# Patient Record
Sex: Male | Born: 1990 | Race: White | Hispanic: No | Marital: Single | Smoking: Current every day smoker
Health system: Southern US, Community
[De-identification: ages and names within clinical notes are randomized; demographics above are authoritative.]

---

## 2009-08-05 ENCOUNTER — Emergency Department (HOSPITAL_COMMUNITY): Admission: EM | Admit: 2009-08-05 | Discharge: 2009-08-06 | Payer: Self-pay | Admitting: Emergency Medicine

## 2012-01-09 ENCOUNTER — Emergency Department (HOSPITAL_COMMUNITY)
Admission: EM | Admit: 2012-01-09 | Discharge: 2012-01-09 | Disposition: A | Discharge status: Home / Self Care | Payer: Self-pay | Attending: Emergency Medicine | Admitting: Emergency Medicine

## 2012-01-09 ENCOUNTER — Encounter (HOSPITAL_COMMUNITY): Payer: Self-pay

## 2012-01-09 DIAGNOSIS — F17200 Nicotine dependence, unspecified, uncomplicated: Secondary | ICD-10-CM | POA: Insufficient documentation

## 2012-01-09 DIAGNOSIS — R21 Rash and other nonspecific skin eruption: Secondary | ICD-10-CM | POA: Insufficient documentation

## 2012-01-09 MED ORDER — PERMETHRIN 5 % EX CREA: TOPICAL_CREAM | CUTANEOUS | Status: DC

## 2012-01-09 MED ORDER — PERMETHRIN 5 % EX CREA: TOPICAL_CREAM | CUTANEOUS | Status: AC

## 2012-01-09 NOTE — ED Provider Notes (Signed)
History     CSN: [id number]  Arrival date & time 01/09/12  1510   First MD Initiated Contact with Patient 01/09/12 1613      Chief Complaint  Patient presents with  . Rash     Patient is a 21 y.o. male presenting with rash. The history is provided by the patient.  Rash  This is a recurrent problem. The current episode started more than 1 week ago. The problem has been gradually worsening. The problem is associated with nothing. There has been no fever. Affected Location: extremities, torso. The patient is experiencing no pain. The pain has been constant since onset. Associated symptoms include itching. Pertinent negatives include no pain and no weeping. He has tried antihistamines, steriods and anti-itch cream for the symptoms. The treatment provided mild relief.  pt denies fever/headache/tick bites or insect bites Has been seen at outside hospital, given meds with mild improvement then returns No other complaints No one else with these symptoms  PMh - none  History reviewed. No pertinent past surgical history.  No family history on file.  History  Substance Use Topics  . Smoking status: Current Everyday Smoker  . Smokeless tobacco: Not on file  . Alcohol Use: No      Review of Systems  Constitutional: Negative for fever.  Skin: Positive for itching and rash.    Allergies  Review of patient's allergies indicates no known allergies.  Home Medications   Current Outpatient Rx  Name Route Sig Dispense Refill  . PERMETHRIN 5 % EX CREA  Apply to affected area once 10 g 0    BP 134/68  Pulse 61  Temp 97.7 F (36.5 C) (Oral)  Resp 16  Ht 5\' 10"  (1.778 m)  Wt 135 lb (61.236 kg)  BMI 19.37 kg/m2  SpO2 100%  Physical Exam CONSTITUTIONAL: Well developed/well nourished HEAD AND FACE: Normocephalic/atraumatic EYES: EOMI/PERRL ENMT: Mucous membranes moist NECK: supple no meningeal signs SPINE:entire spine nontender CV: S1/S2 noted, no murmurs/rubs/gallops  noted LUNGS: Lungs are clear to auscultation bilaterally, no apparent distress ABDOMEN: soft, nontender, no rebound or guarding GU:no cva tenderness NEURO: Pt is awake/alert, moves all extremitiesx4 EXTREMITIES: pulses normal, full ROM, scattered papules to arms SKIN: warm, color normal, scattered papules to chest/abdomen No petechiae, no signs of vasculitic rash, spares palms PSYCH: no abnormalities of mood noted  ED Course  Procedures    1. Rash    Pt has not tried elimite as of yet, will try this due to recurrent rash He is nontoxic in appearance Stable for d/c   MDM  Nursing notes including past medical history and social history reviewed and considered in documentation         Joya Gaskins, MD 01/09/12 1701

## 2012-01-09 NOTE — ED Notes (Signed)
Complain of scatter rash that comes and goes

## 2012-03-10 ENCOUNTER — Encounter (HOSPITAL_COMMUNITY): Payer: Self-pay | Admitting: *Deleted

## 2012-03-10 ENCOUNTER — Emergency Department (HOSPITAL_COMMUNITY)
Admission: EM | Admit: 2012-03-10 | Discharge: 2012-03-10 | Disposition: A | Discharge status: Home / Self Care | Payer: Self-pay | Attending: Emergency Medicine | Admitting: Emergency Medicine

## 2012-03-10 DIAGNOSIS — K029 Dental caries, unspecified: Secondary | ICD-10-CM

## 2012-03-10 DIAGNOSIS — L089 Local infection of the skin and subcutaneous tissue, unspecified: Secondary | ICD-10-CM

## 2012-03-10 DIAGNOSIS — F17200 Nicotine dependence, unspecified, uncomplicated: Secondary | ICD-10-CM | POA: Insufficient documentation

## 2012-03-10 MED ORDER — LIDOCAINE HCL (PF) 1 % IJ SOLN
INTRAMUSCULAR | Status: AC
  Administered 2012-03-10: 5 mL
  Filled 2012-03-10: qty 5

## 2012-03-10 MED ORDER — MELOXICAM 15 MG PO TABS: ORAL_TABLET | ORAL | Status: DC

## 2012-03-10 MED ORDER — SULFAMETHOXAZOLE-TRIMETHOPRIM 800-160 MG PO TABS: 1.0000 | ORAL_TABLET | Freq: Two times a day (BID) | ORAL | Status: AC

## 2012-03-10 MED ORDER — CEPHALEXIN 500 MG PO CAPS: 500.0000 mg | ORAL_CAPSULE | Freq: Four times a day (QID) | ORAL | Status: AC

## 2012-03-10 MED ORDER — ONDANSETRON HCL 4 MG PO TABS
4.0000 mg | ORAL_TABLET | Freq: Once | ORAL | Status: AC
  Administered 2012-03-10: 4 mg via ORAL
  Filled 2012-03-10: qty 1

## 2012-03-10 MED ORDER — HYDROCODONE-ACETAMINOPHEN 5-325 MG PO TABS: 1.0000 | ORAL_TABLET | ORAL | Status: AC | PRN

## 2012-03-10 MED ORDER — CEFTRIAXONE SODIUM 1 G IJ SOLR
1.0000 g | Freq: Once | INTRAMUSCULAR | Status: AC
  Administered 2012-03-10: 1 g via INTRAMUSCULAR
  Filled 2012-03-10: qty 10

## 2012-03-10 MED ORDER — HYDROCODONE-ACETAMINOPHEN 5-325 MG PO TABS
1.0000 | ORAL_TABLET | Freq: Once | ORAL | Status: AC
  Administered 2012-03-10: 1 via ORAL
  Filled 2012-03-10: qty 1

## 2012-03-10 MED ORDER — KETOROLAC TROMETHAMINE 10 MG PO TABS
10.0000 mg | ORAL_TABLET | Freq: Once | ORAL | Status: AC
  Administered 2012-03-10: 10 mg via ORAL
  Filled 2012-03-10: qty 1

## 2012-03-10 NOTE — ED Provider Notes (Signed)
Medical screening examination/treatment/procedure(s) were performed by non-physician practitioner and as supervising physician I was immediately available for consultation/collaboration.  Jaceyon Strole, MD 03/10/12 1901 

## 2012-03-10 NOTE — ED Notes (Signed)
Pt c/o swelling to his right jaw and neck x 2 days.

## 2012-03-10 NOTE — ED Notes (Signed)
Patient with no complaints at this time. Respirations even and unlabored. Skin warm/dry. Discharge instructions reviewed with patient at this time. Patient given opportunity to voice concerns/ask questions. Patient discharged at this time and left Emergency Department with steady gait.   

## 2012-03-10 NOTE — ED Notes (Signed)
R side of face swollen. Slightly inflamed lymph nodes on R neck.  Approx. 2cm round scabbed area swollen on R jawline.  Neck is reddened and painful to light palpation.  Patient states it hurts to open mouth.

## 2012-03-10 NOTE — ED Provider Notes (Signed)
History     CSN: [id number]  Arrival date & time 03/10/12  1746   First MD Initiated Contact with Patient 03/10/12 1839      Chief Complaint  Patient presents with  . Facial Swelling    (Consider location/radiation/quality/duration/timing/severity/associated sxs/prior treatment) HPI Comments: Patient reports a two-day history of pain and swelling in the right jaw and neck. He first noticed areas coming up in the face near the shaving areas. He then noted pain from the angle of the jaw into the neck. He has not had problems swallowing or speaking. His been no problems with his breathing. He has not had any drainage from the lesions of the skin on the right side of the jaw. He has cavities, but has only had minimal problems with chewing. He has not had any high fever reported. He has taken Tylenol and ibuprofen but states the pain has not gone away.  The history is provided by the patient.    History reviewed. No pertinent past medical history.  History reviewed. No pertinent past surgical history.  History reviewed. No pertinent family history.  History  Substance Use Topics  . Smoking status: Current Everyday Smoker    Types: Cigarettes  . Smokeless tobacco: Not on file  . Alcohol Use: No      Review of Systems  Constitutional: Negative for activity change.       All ROS Neg except as noted in HPI  HENT: Positive for dental problem. Negative for nosebleeds and neck pain.   Eyes: Negative for photophobia and discharge.  Respiratory: Negative for cough, shortness of breath and wheezing.   Cardiovascular: Negative for chest pain and palpitations.  Gastrointestinal: Negative for abdominal pain and blood in stool.  Genitourinary: Negative for dysuria, frequency and hematuria.  Musculoskeletal: Negative for back pain and arthralgias.  Skin: Negative.   Neurological: Negative for dizziness, seizures and speech difficulty.  Psychiatric/Behavioral: Negative for hallucinations  and confusion.    Allergies  Review of patient's allergies indicates no known allergies.  Home Medications  No current outpatient prescriptions on file.  BP 137/73  Pulse 67  Temp 98.2 F (36.8 C) (Oral)  Resp 24  Ht 5\' 10"  (1.778 m)  Wt 135 lb (61.236 kg)  BMI 19.37 kg/m2  SpO2 99%  Physical Exam  Nursing note and vitals reviewed. Constitutional: He is oriented to person, place, and time. He appears well-developed and well-nourished.  Non-toxic appearance.  HENT:  Head: Normocephalic.  Right Ear: Tympanic membrane and external ear normal.  Left Ear: Tympanic membrane and external ear normal.       There are 3 raised red areas on the right side of the face. Most of them are in the shaving area. There is soreness to palpation at the right jaw and submental area. There are a few palpable nodes in the cervical chain.  There are multiple cavities of the right and left upper and lower jaw area there is no visible abscess in the mouth. There is no swelling under the tongue. The airway is patent, the uvula is in the midline.  Eyes: EOM and lids are normal. Pupils are equal, round, and reactive to light.  Neck: Normal range of motion. Neck supple. Carotid bruit is not present.  Cardiovascular: Normal rate, regular rhythm, intact distal pulses and normal pulses.   Murmur heard. Pulmonary/Chest: Breath sounds normal. No respiratory distress.  Abdominal: Soft. Bowel sounds are normal. There is no tenderness. There is no guarding.  Musculoskeletal: Normal range  of motion.  Lymphadenopathy:       Head (right side): No submandibular adenopathy present.       Head (left side): No submandibular adenopathy present.    He has no cervical adenopathy.  Neurological: He is alert and oriented to person, place, and time. He has normal strength. No cranial nerve deficit or sensory deficit.  Skin: Skin is warm and dry.  Psychiatric: He has a normal mood and affect. His speech is normal.    ED  Course  Procedures (including critical care time)  Labs Reviewed - No data to display No results found.   No diagnosis found.    MDM  I have reviewed nursing notes, vital signs, and all appropriate lab and imaging results for this patient. Patient has infected lesions of the skin in the shaving area. He has multiple dental caries on the right and left side. The patient is treated in the emergency department with Rocephin IM. He is given a prescription for Keflex 500 mg 4 times a day, Bactrim DS 2 times daily with food, and Norco one every 4 hours #15 tablets. Patient strongly advised to see his dentist as sone as possible.       Kathie Dike, [place] 03/10/12 [place]

## 2013-02-24 ENCOUNTER — Emergency Department (HOSPITAL_COMMUNITY): Payer: Self-pay

## 2013-02-24 ENCOUNTER — Encounter (HOSPITAL_COMMUNITY): Payer: Self-pay | Admitting: Emergency Medicine

## 2013-02-24 ENCOUNTER — Emergency Department (HOSPITAL_COMMUNITY)
Admission: EM | Admit: 2013-02-24 | Discharge: 2013-02-24 | Disposition: A | Discharge status: Home / Self Care | Payer: Self-pay | Attending: Emergency Medicine | Admitting: Emergency Medicine

## 2013-02-24 DIAGNOSIS — Y93H2 Activity, gardening and landscaping: Secondary | ICD-10-CM | POA: Insufficient documentation

## 2013-02-24 DIAGNOSIS — X500XXA Overexertion from strenuous movement or load, initial encounter: Secondary | ICD-10-CM | POA: Insufficient documentation

## 2013-02-24 DIAGNOSIS — S93401A Sprain of unspecified ligament of right ankle, initial encounter: Secondary | ICD-10-CM

## 2013-02-24 DIAGNOSIS — F17200 Nicotine dependence, unspecified, uncomplicated: Secondary | ICD-10-CM | POA: Insufficient documentation

## 2013-02-24 DIAGNOSIS — S93409A Sprain of unspecified ligament of unspecified ankle, initial encounter: Secondary | ICD-10-CM | POA: Insufficient documentation

## 2013-02-24 DIAGNOSIS — Y92009 Unspecified place in unspecified non-institutional (private) residence as the place of occurrence of the external cause: Secondary | ICD-10-CM | POA: Insufficient documentation

## 2013-02-24 MED ORDER — NAPROXEN 250 MG PO TABS: 250.0000 mg | ORAL_TABLET | Freq: Two times a day (BID) | ORAL | Status: DC

## 2013-02-24 MED ORDER — IBUPROFEN 400 MG PO TABS
400.0000 mg | ORAL_TABLET | Freq: Once | ORAL | Status: AC
  Administered 2013-02-24: 400 mg via ORAL
  Filled 2013-02-24: qty 1

## 2013-02-24 MED ORDER — HYDROCODONE-ACETAMINOPHEN 5-325 MG PO TABS: ORAL_TABLET | ORAL | Status: DC

## 2013-02-24 MED ORDER — HYDROCODONE-ACETAMINOPHEN 5-325 MG PO TABS
2.0000 | ORAL_TABLET | Freq: Once | ORAL | Status: AC
  Administered 2013-02-24: 2 via ORAL
  Filled 2013-02-24: qty 2

## 2013-02-24 NOTE — ED Notes (Signed)
Patient states he was weedeating earlier and he tripped and twisted his right ankle; patient states he thinks he was stung by a yellow jacket at the same time.

## 2013-02-24 NOTE — ED Provider Notes (Signed)
CSN: [id number]     Arrival date & time 02/24/13  0032 History     First MD Initiated Contact with Patient 02/24/13 0056     Chief Complaint  Patient presents with  . Ankle Injury    HPI Pt was seen at 0120. Per pt, c/o gradual onset and persistence of constant right ankle "pain" that began yesterday evening PTA. Pt states he was using the weedeater on his lawn and tripped, "twisting" his right ankle. Pt has been ambulatory since the incident. Pt c/o pain and swelling to his right ankle. Denies any other injuries. Denies open wounds, no erythema, no ecchymosis, no knee or foot pain, no tingling/numbness in extremities, no focal motor weakness, no fevers, no rash.    History reviewed. No pertinent past medical history.  History reviewed. No pertinent past surgical history.  History  Substance Use Topics  . Smoking status: Current Every Day Smoker    Types: Cigarettes  . Smokeless tobacco: Not on file  . Alcohol Use: Yes    Review of Systems ROS: Statement: All systems negative except as marked or noted in the HPI; Constitutional: Negative for fever and chills. ; ; Eyes: Negative for eye pain, redness and discharge. ; ; ENMT: Negative for ear pain, hoarseness, nasal congestion, sinus pressure and sore throat. ; ; Cardiovascular: Negative for chest pain, palpitations, diaphoresis, dyspnea and peripheral edema. ; ; Respiratory: Negative for cough, wheezing and stridor. ; ; Gastrointestinal: Negative for nausea, vomiting, diarrhea, abdominal pain, blood in stool, hematemesis, jaundice and rectal bleeding. . ; ; Genitourinary: Negative for dysuria, flank pain and hematuria. ; ; Musculoskeletal: Negative for back pain and neck pain. +right ankle pain, swelling.; ; Skin: Negative for pruritus, rash, abrasions, blisters, bruising and skin lesion.; ; Neuro: Negative for headache, lightheadedness and neck stiffness. Negative for weakness, altered level of consciousness , altered mental status,  extremity weakness, paresthesias, involuntary movement, seizure and syncope.       Allergies  Review of patient's allergies indicates no known allergies.  Home Medications   Current Outpatient Rx  Name  Route  Sig  Dispense  Refill  . HYDROcodone-acetaminophen (NORCO/VICODIN) 5-325 MG per tablet      1 or 2 tabs PO q6 hours prn pain   20 tablet   0   . meloxicam (MOBIC) 15 MG tablet      1 po bid with food for swelling and inflammation   12 tablet   0   . naproxen (NAPROSYN) 250 MG tablet   Oral   Take 1 tablet (250 mg total) by mouth 2 (two) times daily with a meal.   14 tablet   0    BP 118/69  Pulse 76  Temp(Src) 98.2 F (36.8 C) (Oral)  Resp 18  Ht 5\' 10"  (1.778 m)  Wt 135 lb (61.236 kg)  BMI 19.37 kg/m2  SpO2 100% Physical Exam 0125: Physical examination:  Nursing notes reviewed; Vital signs and O2 SAT reviewed;  Constitutional: Well developed, Well nourished, Well hydrated, In no acute distress; Head:  Normocephalic, atraumatic; Eyes: EOMI, PERRL, No scleral icterus; ENMT: Mouth and pharynx normal, Mucous membranes moist; Neck: Supple, Full range of motion, No lymphadenopathy; Cardiovascular: Regular rate and rhythm, No murmur, rub, or gallop; Respiratory: Breath sounds clear & equal bilaterally, No rales, rhonchi, wheezes.  Speaking full sentences with ease, Normal respiratory effort/excursion; Chest: Nontender, Movement normal; Abdomen: Soft, Nontender, Nondistended, Normal bowel sounds; Genitourinary: No CVA tenderness; Extremities: Pulses normal, +mild tenderness to palp  right lateral maleolar area w/localized edema, NMS intact right foot, strong pedal pp, LE muscle compartments soft.  No right proximal fibular head tenderness, no knee tenderness, no foot tenderness.  No deformity, no ecchymosis, no open wounds.  +plantarflexion of right foot w/calf squeeze.  No palpable gap right Achilles's tendon. No deformity. No calf edema or asymmetry.; Neuro: AA&Ox3, Major CN  grossly intact.  Speech clear. No gross focal motor or sensory deficits in extremities.; Skin: Color normal, Warm, Dry, no rash.   ED Course   Procedures  MDM  MDM Reviewed: previous chart, nursing note and vitals Interpretation: x-ray   Dg Ankle Complete Right 02/24/2013   *RADIOLOGY REPORT*  Clinical Data: Twisting injury 02/23/2013.  Right ankle pain.  RIGHT ANKLE - COMPLETE 3+ VIEW  Comparison: None.  Findings: There is soft tissue swelling about the lateral aspect of the ankle.  Underlying fracture or dislocation.  No tibiotalar joint effusion.  IMPRESSION: Lateral soft tissue swelling without underlying bony or joint abnormality.   Original Report Authenticated By: Holley Dexter, M.D.    [phone]:  No fx on XR, will tx symptomatically. Pt states he "might have gotten stung by a yellow jacket" on his leg, also today. No localized or diffuse rash to suggest insect sting. Dx and testing d/w pt.  Questions answered.  Verb understanding, agreeable to d/c home with outpt f/u.    Laray Anger, DO 02/25/13 1758

## 2015-11-11 ENCOUNTER — Emergency Department (HOSPITAL_COMMUNITY)
Admission: EM | Admit: 2015-11-11 | Discharge: 2015-11-11 | Disposition: A | Discharge status: Home / Self Care | Payer: Self-pay | Attending: Emergency Medicine | Admitting: Emergency Medicine

## 2015-11-11 ENCOUNTER — Encounter (HOSPITAL_COMMUNITY): Payer: Self-pay | Admitting: Emergency Medicine

## 2015-11-11 DIAGNOSIS — Z79899 Other long term (current) drug therapy: Secondary | ICD-10-CM | POA: Insufficient documentation

## 2015-11-11 DIAGNOSIS — K047 Periapical abscess without sinus: Secondary | ICD-10-CM

## 2015-11-11 DIAGNOSIS — K029 Dental caries, unspecified: Secondary | ICD-10-CM

## 2015-11-11 DIAGNOSIS — F17210 Nicotine dependence, cigarettes, uncomplicated: Secondary | ICD-10-CM | POA: Insufficient documentation

## 2015-11-11 DIAGNOSIS — Z7982 Long term (current) use of aspirin: Secondary | ICD-10-CM | POA: Insufficient documentation

## 2015-11-11 MED ORDER — AMOXICILLIN 250 MG PO CAPS
500.0000 mg | ORAL_CAPSULE | Freq: Once | ORAL | Status: AC
  Administered 2015-11-11: 500 mg via ORAL
  Filled 2015-11-11: qty 2

## 2015-11-11 MED ORDER — ONDANSETRON 4 MG PO TBDP
4.0000 mg | ORAL_TABLET | Freq: Once | ORAL | Status: AC
  Administered 2015-11-11: 4 mg via ORAL
  Filled 2015-11-11: qty 1

## 2015-11-11 MED ORDER — IBUPROFEN 800 MG PO TABS
800.0000 mg | ORAL_TABLET | Freq: Once | ORAL | Status: AC
  Administered 2015-11-11: 800 mg via ORAL
  Filled 2015-11-11: qty 1

## 2015-11-11 MED ORDER — AMOXICILLIN 500 MG PO CAPS: 500.0000 mg | ORAL_CAPSULE | Freq: Three times a day (TID) | ORAL | Status: DC

## 2015-11-11 MED ORDER — TRAMADOL HCL 50 MG PO TABS
100.0000 mg | ORAL_TABLET | Freq: Once | ORAL | Status: AC
Start: 2015-11-11 — End: 2015-11-11
  Administered 2015-11-11: 100 mg via ORAL
  Filled 2015-11-11: qty 2

## 2015-11-11 MED ORDER — TRAMADOL HCL 50 MG PO TABS: 50.0000 mg | ORAL_TABLET | Freq: Four times a day (QID) | ORAL | Status: DC | PRN

## 2015-11-11 NOTE — ED Notes (Signed)
Pt c/o dental pain x 4 days on the right side.

## 2015-11-11 NOTE — ED Provider Notes (Signed)
CSN: [id number]     Arrival date & time 11/11/15  2233 History   First MD Initiated Contact with Patient 11/11/15 2252     Chief Complaint  Patient presents with  . Dental Pain     (Consider location/radiation/quality/duration/timing/severity/associated sxs/prior Treatment) Patient is a 25 y.o. male presenting with tooth pain. The history is provided by the patient.  Dental Pain Location:  Lower Quality:  Aching and shooting Severity:  Moderate Onset quality:  Gradual Duration:  4 days Timing:  Intermittent Progression:  Worsening Chronicity:  Chronic Context: dental caries and poor dentition   Relieved by:  Nothing Worsened by:  Nothing tried Associated symptoms: facial pain and gum swelling   Associated symptoms: no drooling   Risk factors: lack of dental care and smoking     History reviewed. No pertinent past medical history. History reviewed. No pertinent past surgical history. No family history on file. Social History  Substance Use Topics  . Smoking status: Current Every Day Smoker    Types: Cigarettes  . Smokeless tobacco: None  . Alcohol Use: Yes    Review of Systems  HENT: Positive for dental problem. Negative for drooling.   All other systems reviewed and are negative.     Allergies  Review of patient's allergies indicates no known allergies.  Home Medications   Prior to Admission medications   Medication Sig Start Date End Date Taking? Authorizing Provider  HYDROcodone-acetaminophen (NORCO/VICODIN) 5-325 MG per tablet 1 or 2 tabs PO q6 hours prn pain 02/24/13   [name], DO  meloxicam (MOBIC) 15 MG tablet 1 po bid with food for swelling and inflammation 03/10/12   [name], PA-C  naproxen (NAPROSYN) 250 MG tablet Take 1 tablet (250 mg total) by mouth 2 (two) times daily with a meal. 02/24/13   [name], DO   BP 137/84 mmHg  Temp(Src) 98.8 F (37.1 C) (Oral)  Resp 20  Ht  (1.778 m)  Wt 65.772 kg  BMI 20.81 kg/m2  SpO2  98% Physical Exam  Constitutional: He is oriented to person, place, and time. He appears well-developed and well-nourished.  Non-toxic appearance.  HENT:  Head: Normocephalic.  Right Ear: Tympanic membrane and external ear normal.  Left Ear: Tympanic membrane and external ear normal.  Multiple dental caries noted of the upper and lower. Also on both right and left. The right lower jaw has some swelling of the gum. No visible abscess appreciated. No swelling under the tongue. The airway is patent.  Eyes: EOM and lids are normal. Pupils are equal, round, and reactive to light.  Neck: Normal range of motion. Neck supple. Carotid bruit is not present.  Cardiovascular: Normal rate, regular rhythm, normal heart sounds, intact distal pulses and normal pulses.   Pulmonary/Chest: Breath sounds normal. No respiratory distress.  Abdominal: Soft. Bowel sounds are normal. There is no tenderness. There is no guarding.  Musculoskeletal: Normal range of motion.  Lymphadenopathy:       Head (right side): No submandibular adenopathy present.       Head (left side): No submandibular adenopathy present.    He has no cervical adenopathy.  Neurological: He is alert and oriented to person, place, and time. He has normal strength. No cranial nerve deficit or sensory deficit.  Skin: Skin is warm and dry.  Psychiatric: He has a normal mood and affect. His speech is normal.  Nursing note and vitals reviewed.   ED Course  Procedures (including critical care time) Labs Review Labs  Reviewed - No data to display  Imaging Review No results found. I have personally reviewed and evaluated these images and lab results as part of my medical decision-making.   EKG Interpretation None      MDM  I discussed the severity of the cavities with the patient. Also discussed with him the possibility of more serious infection, and the necessity of seeing a dentist systems possible. Prescription for Amoxil and Ultram given  to the patient. Patient is to use 600 mg of ibuprofen every 6 hours for mild-to-moderate pain. The patient acknowledges understanding of these discharge instructions.    Final diagnoses:  None    **I have reviewed nursing notes, vital signs, and all appropriate lab and imaging results for this patient.[name]*    [name], PA-C 11/11/15 2337  Benjiman CoreNathan Pickering, MD 11/12/15 [phone]

## 2015-11-11 NOTE — ED Notes (Signed)
Link SnufferHobson PA in prior to RN, see pa assessment for further,

## 2015-11-11 NOTE — Discharge Instructions (Signed)

## 2015-11-11 NOTE — ED Notes (Signed)
Pt verbalizes understanding of DC instruction and followup to exit with family member

## 2015-12-04 ENCOUNTER — Encounter (HOSPITAL_COMMUNITY): Payer: Self-pay | Admitting: Emergency Medicine

## 2015-12-04 ENCOUNTER — Emergency Department (HOSPITAL_COMMUNITY)
Admission: EM | Admit: 2015-12-04 | Discharge: 2015-12-05 | Disposition: A | Discharge status: Home / Self Care | Payer: No Typology Code available for payment source | Attending: Emergency Medicine | Admitting: Emergency Medicine

## 2015-12-04 ENCOUNTER — Emergency Department (HOSPITAL_COMMUNITY): Payer: No Typology Code available for payment source

## 2015-12-04 DIAGNOSIS — Y999 Unspecified external cause status: Secondary | ICD-10-CM | POA: Diagnosis not present

## 2015-12-04 DIAGNOSIS — Y9389 Activity, other specified: Secondary | ICD-10-CM | POA: Diagnosis not present

## 2015-12-04 DIAGNOSIS — R109 Unspecified abdominal pain: Secondary | ICD-10-CM

## 2015-12-04 DIAGNOSIS — F17210 Nicotine dependence, cigarettes, uncomplicated: Secondary | ICD-10-CM | POA: Diagnosis not present

## 2015-12-04 DIAGNOSIS — Y92810 Car as the place of occurrence of the external cause: Secondary | ICD-10-CM | POA: Insufficient documentation

## 2015-12-04 DIAGNOSIS — W228XXA Striking against or struck by other objects, initial encounter: Secondary | ICD-10-CM | POA: Insufficient documentation

## 2015-12-04 DIAGNOSIS — R1010 Upper abdominal pain, unspecified: Secondary | ICD-10-CM | POA: Insufficient documentation

## 2015-12-04 DIAGNOSIS — M542 Cervicalgia: Secondary | ICD-10-CM | POA: Diagnosis not present

## 2015-12-04 MED ORDER — TRAMADOL HCL 50 MG PO TABS: 50.0000 mg | ORAL_TABLET | Freq: Four times a day (QID) | ORAL | Status: DC | PRN

## 2015-12-04 MED ORDER — NAPROXEN 250 MG PO TABS: 250.0000 mg | ORAL_TABLET | Freq: Two times a day (BID) | ORAL | Status: AC | PRN

## 2015-12-04 MED ORDER — METHOCARBAMOL 500 MG PO TABS: 1000.0000 mg | ORAL_TABLET | Freq: Four times a day (QID) | ORAL | Status: AC | PRN

## 2015-12-04 MED ORDER — IOPAMIDOL (ISOVUE-300) INJECTION 61%
100.0000 mL | Freq: Once | INTRAVENOUS | Status: AC | PRN
  Administered 2015-12-04: 100 mL via INTRAVENOUS
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
## 2015-12-04 NOTE — ED Notes (Signed)
c-collar placed in triage

## 2015-12-04 NOTE — Discharge Instructions (Signed)
Take the prescriptions as directed.  Apply moist heat or ice to the area(s) of discomfort, for 15 minutes at a time, several times per day for the next few days.  Do not fall asleep on a heating or ice pack.  Call your regular medical doctor today to schedule a follow up appointment in the next 2 days.  Return to the Emergency Department immediately if worsening.
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
## 2016-12-23 ENCOUNTER — Encounter (HOSPITAL_COMMUNITY): Payer: Self-pay | Admitting: Emergency Medicine

## 2016-12-23 ENCOUNTER — Emergency Department (HOSPITAL_COMMUNITY)
Admission: EM | Admit: 2016-12-23 | Discharge: 2016-12-23 | Disposition: A | Discharge status: Home / Self Care | Payer: Self-pay | Attending: Emergency Medicine | Admitting: Emergency Medicine

## 2016-12-23 DIAGNOSIS — Z79899 Other long term (current) drug therapy: Secondary | ICD-10-CM | POA: Insufficient documentation

## 2016-12-23 DIAGNOSIS — F17210 Nicotine dependence, cigarettes, uncomplicated: Secondary | ICD-10-CM | POA: Insufficient documentation

## 2016-12-23 DIAGNOSIS — K047 Periapical abscess without sinus: Secondary | ICD-10-CM | POA: Insufficient documentation

## 2016-12-23 MED ORDER — IBUPROFEN 800 MG PO TABS
800.0000 mg | ORAL_TABLET | Freq: Once | ORAL | Status: AC
  Administered 2016-12-23: 800 mg via ORAL
  Filled 2016-12-23: qty 1

## 2016-12-23 MED ORDER — TRAMADOL HCL 50 MG PO TABS: 50.0000 mg | ORAL_TABLET | Freq: Four times a day (QID) | ORAL | 0 refills | Status: DC | PRN

## 2016-12-23 MED ORDER — AMOXICILLIN 500 MG PO CAPS: 500.0000 mg | ORAL_CAPSULE | Freq: Three times a day (TID) | ORAL | 0 refills | Status: AC

## 2016-12-23 MED ORDER — IBUPROFEN 800 MG PO TABS: 800.0000 mg | ORAL_TABLET | Freq: Three times a day (TID) | ORAL | 0 refills | Status: DC

## 2016-12-23 MED ORDER — AMOXICILLIN 250 MG PO CAPS
500.0000 mg | ORAL_CAPSULE | Freq: Once | ORAL | Status: AC
  Administered 2016-12-23: 500 mg via ORAL
  Filled 2016-12-23: qty 2

## 2016-12-23 NOTE — Discharge Instructions (Signed)
Complete your entire course of antibiotics as prescribed.  You  may use the ultram (tramadol) for pain relief but do not drive within 4 hours of taking as this will make you drowsy.  Avoid applying heat or ice to this abscess area which can worsen your symptoms.  You may use warm salt water swish and spit treatment or half peroxide and water swish and spit after meals to keep this area clean as discussed.  See the dental referral sheet for help finding a dentist.

## 2016-12-23 NOTE — ED Notes (Signed)
Pt ambulatory to waiting room. Pt verbalized understanding of discharge instructions.   

## 2016-12-23 NOTE — ED Triage Notes (Signed)
Broken tooth causing pain a swelling on lower right side

## 2016-12-25 NOTE — ED Provider Notes (Signed)
[facility]-EMERGENCY DEPT Provider Note   CSN: [id number] Arrival date & time: 12/23/16  1709     History   Chief Complaint Chief Complaint  Patient presents with  . Dental Pain    HPI Collin Dunn is a 26 y.o. male presenting with a 2 day history of dental pain and gingival swelling.   The patient has intermittent problems with his teeth as he has advanced decay with multiple cavities.  States was eating several days ago when he bit into a hard piece of food and now has increased pain and swelling along his jawline.  There has been no fevers, chills, nausea or vomiting, also no complaint of difficulty swallowing, although chewing makes pain worse.  The patient has been eating soft foods and has tried otc meds without relief of symptoms.    .  The history is provided by the patient.    History reviewed. No pertinent past medical history.  There are no active problems to display for this patient.   History reviewed. No pertinent surgical history.     Home Medications    Prior to Admission medications   Medication Sig Start Date End Date Taking? Authorizing Provider  acetaminophen (TYLENOL) 500 MG tablet Take 500 mg by mouth every 6 (six) hours as needed for mild pain or moderate pain.    [provider]  amoxicillin (AMOXIL) 500 MG capsule Take 1 capsule (500 mg total) by mouth 3 (three) times daily. 12/23/16 01/02/17  Burgess AmorIdol, Roney Youtz, PA-C  Aspirin-Acetaminophen-Caffeine (GOODY HEADACHE PO) Take 1 packet by mouth daily as needed (FOR PAIN).    [provider]  ibuprofen (ADVIL,MOTRIN) 800 MG tablet Take 1 tablet (800 mg total) by mouth 3 (three) times daily. 12/23/16   Burgess AmorIdol, Camera Krienke, PA-C  methocarbamol (ROBAXIN) 500 MG tablet Take 2 tablets (1,000 mg total) by mouth 4 (four) times daily as needed for muscle spasms (muscle spasm/pain). 12/04/15   Samuel JesterMcManus, Kathleen, DO  naproxen (NAPROSYN) 250 MG tablet Take 1 tablet (250 mg total) by mouth 2 (two) times daily as needed for mild  pain or moderate pain (take with food). 12/04/15   Samuel JesterMcManus, Kathleen, DO  traMADol (ULTRAM) 50 MG tablet Take 1 tablet (50 mg total) by mouth every 6 (six) hours as needed. 12/23/16   Burgess AmorIdol, Joie Reamer, PA-C    Family History No family history on file.  Social History Social History  Substance Use Topics  . Smoking status: Current Every Day Smoker    Packs/day: 1.00    Types: Cigarettes  . Smokeless tobacco: Never Used  . Alcohol use No     Allergies   Patient has no known allergies.   Review of Systems Review of Systems  Constitutional: Negative for fever.  HENT: Positive for dental problem. Negative for facial swelling and sore throat.   Respiratory: Negative for shortness of breath.   Musculoskeletal: Negative for neck pain and neck stiffness.     Physical Exam Updated Vital Signs BP 138/82 (BP Location: Right Arm)   Pulse 68   Temp 97.8 F (36.6 C) (Oral)   Resp 18   Ht 5\' 10"  (1.778 m)   Wt 61.2 kg (135 lb)   SpO2 98%   BMI 19.37 kg/m   Physical Exam  Constitutional: He is oriented to person, place, and time. He appears well-developed and well-nourished. No distress.  HENT:  Head: Normocephalic and atraumatic.  Right Ear: Tympanic membrane and external ear normal.  Left Ear: Tympanic membrane and external ear normal.  Mouth/Throat: Oropharynx is clear and moist and mucous membranes are normal. No oral lesions. No trismus in the jaw. Dental abscesses present.  Generalized poor dentition. Multiple fractures, decay and with lateral right lower gingival edema.  No fluctuance or drainage.  Molars along this lower side have deep decayed pockets into the occlusive surfaces. No facial erythema. sublingual space soft.  Eyes: Conjunctivae are normal.  Neck: Normal range of motion. Neck supple.  Cardiovascular: Normal rate and normal heart sounds.   Pulmonary/Chest: Effort normal.  Abdominal: He exhibits no distension.  Musculoskeletal: Normal range of motion.    Lymphadenopathy:    He has no cervical adenopathy.  Neurological: He is alert and oriented to person, place, and time.  Skin: Skin is warm and dry. No erythema.  Psychiatric: He has a normal mood and affect.     ED Treatments / Results  Labs (all labs ordered are listed, but only abnormal results are displayed) Labs Reviewed - No data to display  EKG  EKG Interpretation None       Radiology No results found.  Procedures Procedures (including critical care time)  Medications Ordered in ED Medications  amoxicillin (AMOXIL) capsule 500 mg (500 mg Oral Given 12/23/16 1906)  ibuprofen (ADVIL,MOTRIN) tablet 800 mg (800 mg Oral Given 12/23/16 1907)     Initial Impression / Assessment and Plan / ED Course  I have reviewed the triage vital signs and the nursing notes.  Pertinent labs & imaging results that were available during my care of the patient were reviewed by me and considered in my medical decision making (see chart for details).     Dental referrals given. Amoxil, ibu, tramadol.  No evidence of drainable abscess, no respiratory compromise.  Final Clinical Impressions(s) / ED Diagnoses   Final diagnoses:  Dental abscess    New Prescriptions Discharge Medication List as of 12/23/2016  7:00 PM       Victoriano Lain 12/25/16 1309    Dione Booze, MD 12/25/16 2253

## 2017-02-14 ENCOUNTER — Encounter (HOSPITAL_COMMUNITY): Payer: Self-pay | Admitting: Emergency Medicine

## 2017-02-14 ENCOUNTER — Emergency Department (HOSPITAL_COMMUNITY)
Admission: EM | Admit: 2017-02-14 | Discharge: 2017-02-14 | Disposition: A | Discharge status: Home / Self Care | Payer: Self-pay | Attending: Emergency Medicine | Admitting: Emergency Medicine

## 2017-02-14 DIAGNOSIS — Z7982 Long term (current) use of aspirin: Secondary | ICD-10-CM | POA: Insufficient documentation

## 2017-02-14 DIAGNOSIS — K0889 Other specified disorders of teeth and supporting structures: Secondary | ICD-10-CM | POA: Insufficient documentation

## 2017-02-14 DIAGNOSIS — K047 Periapical abscess without sinus: Secondary | ICD-10-CM

## 2017-02-14 DIAGNOSIS — K029 Dental caries, unspecified: Secondary | ICD-10-CM

## 2017-02-14 DIAGNOSIS — F17210 Nicotine dependence, cigarettes, uncomplicated: Secondary | ICD-10-CM | POA: Insufficient documentation

## 2017-02-14 DIAGNOSIS — Z79899 Other long term (current) drug therapy: Secondary | ICD-10-CM | POA: Insufficient documentation

## 2017-02-14 MED ORDER — IBUPROFEN 600 MG PO TABS: 600.0000 mg | ORAL_TABLET | Freq: Four times a day (QID) | ORAL | 0 refills | Status: AC

## 2017-02-14 MED ORDER — IBUPROFEN 800 MG PO TABS
800.0000 mg | ORAL_TABLET | Freq: Once | ORAL | Status: AC
  Administered 2017-02-14: 800 mg via ORAL
  Filled 2017-02-14: qty 1

## 2017-02-14 MED ORDER — ONDANSETRON HCL 4 MG PO TABS
4.0000 mg | ORAL_TABLET | Freq: Once | ORAL | Status: AC
  Administered 2017-02-14: 4 mg via ORAL
  Filled 2017-02-14: qty 1

## 2017-02-14 MED ORDER — CLINDAMYCIN HCL 150 MG PO CAPS
300.0000 mg | ORAL_CAPSULE | Freq: Once | ORAL | Status: AC
  Administered 2017-02-14: 300 mg via ORAL
  Filled 2017-02-14: qty 2

## 2017-02-14 MED ORDER — TRAMADOL HCL 50 MG PO TABS
100.0000 mg | ORAL_TABLET | Freq: Once | ORAL | Status: AC
  Administered 2017-02-14: 100 mg via ORAL
  Filled 2017-02-14: qty 2

## 2017-02-14 MED ORDER — CLINDAMYCIN HCL 150 MG PO CAPS
150.0000 mg | ORAL_CAPSULE | Freq: Four times a day (QID) | ORAL | 0 refills | Status: DC
Start: 2017-02-14 — End: 2023-07-07

## 2017-02-14 MED ORDER — TRAMADOL HCL 50 MG PO TABS: 50.0000 mg | ORAL_TABLET | Freq: Four times a day (QID) | ORAL | 0 refills | Status: AC | PRN

## 2017-02-14 NOTE — ED Notes (Signed)
Advised pt not to drive. Pt verbalized understanding. Family was present to drive pt hom e

## 2017-02-14 NOTE — ED Provider Notes (Signed)
[facility] Provider Note   CSN: [id number] Arrival date & time: 02/14/17  1629     History   Chief Complaint Chief Complaint  Patient presents with  . Dental Pain    HPI Collin LeavensSteve Dunn is a 26 y.o. male.  Patient is a 26 year old male who presents to the emergency department with a complaint of dental pain.  Patient gives a 2 to three-day history of pain at the right lower jaw. The patient states this problem is been getting progressively worse for some time, but became severe over the last 2-3 days. He denies any difficulty with swallowing. He's not had any known fever. His been no bleeding from the gum. Patient denies any swelling of the tongue or under the tongue or difficulty speaking. He presents to the emergency department because he feels the infection is gotten worse and he would like assistance with pain.      History reviewed. No pertinent past medical history.  There are no active problems to display for this patient.   History reviewed. No pertinent surgical history.     Home Medications    Prior to Admission medications   Medication Sig Start Date End Date Taking? Authorizing Provider  acetaminophen (TYLENOL) 500 MG tablet Take 500 mg by mouth every 6 (six) hours as needed for mild pain or moderate pain.    [provider]  Aspirin-Acetaminophen-Caffeine (GOODY HEADACHE PO) Take 1 packet by mouth daily as needed (FOR PAIN).    [provider]  ibuprofen (ADVIL,MOTRIN) 800 MG tablet Take 1 tablet (800 mg total) by mouth 3 (three) times daily. 12/23/16   Burgess AmorIdol, Julie, PA-C  methocarbamol (ROBAXIN) 500 MG tablet Take 2 tablets (1,000 mg total) by mouth 4 (four) times daily as needed for muscle spasms (muscle spasm/pain). 12/04/15   Samuel JesterMcManus, Kathleen, DO  naproxen (NAPROSYN) 250 MG tablet Take 1 tablet (250 mg total) by mouth 2 (two) times daily as needed for mild pain or moderate pain (take with food). 12/04/15   Samuel JesterMcManus, Kathleen, DO  traMADol  (ULTRAM) 50 MG tablet Take 1 tablet (50 mg total) by mouth every 6 (six) hours as needed. 12/23/16   Burgess AmorIdol, Julie, PA-C    Family History No family history on file.  Social History Social History  Substance Use Topics  . Smoking status: Current Every Day Smoker    Packs/day: 1.00    Years: 7.00    Types: Cigarettes  . Smokeless tobacco: Never Used  . Alcohol use No     Allergies   Patient has no known allergies.   Review of Systems Review of Systems  Constitutional: Negative for activity change.       All ROS Neg except as noted in HPI  HENT: Positive for dental problem and facial swelling. Negative for nosebleeds.   Eyes: Negative for photophobia and discharge.  Respiratory: Negative for cough, shortness of breath and wheezing.   Cardiovascular: Negative for chest pain and palpitations.  Gastrointestinal: Negative for abdominal pain and blood in stool.  Genitourinary: Negative for dysuria, frequency and hematuria.  Musculoskeletal: Negative for arthralgias, back pain and neck pain.  Skin: Negative.   Neurological: Negative for dizziness, seizures and speech difficulty.  Psychiatric/Behavioral: Negative for confusion and hallucinations.     Physical Exam Updated Vital Signs BP 135/75 (BP Location: Left Arm)   Pulse 98   Temp 98 F (36.7 C) (Oral)   Resp 16   Ht 5\' 10"  (1.778 m)   Wt 65.8 kg (145 lb)  SpO2 96%   BMI 20.81 kg/m   Physical Exam  Constitutional: He is oriented to person, place, and time. He appears well-developed and well-nourished.  Non-toxic appearance.  HENT:  Head: Normocephalic.  Right Ear: Tympanic membrane and external ear normal.  Left Ear: Tympanic membrane and external ear normal.  Multiple cavities of the molars on the right and the left. Most are decayed down to the gumline, or below the gum line. There is significant swelling of the right and left lower gum. The airway is patent. Uvula is in the midline. No swelling under the tongue.    Eyes: Pupils are equal, round, and reactive to light. EOM and lids are normal.  Neck: Normal range of motion. Neck supple. Carotid bruit is not present.  Cardiovascular: Normal rate, regular rhythm, normal heart sounds, intact distal pulses and normal pulses.   Pulmonary/Chest: Breath sounds normal. No respiratory distress.  Abdominal: Soft. Bowel sounds are normal. There is no tenderness. There is no guarding.  Musculoskeletal: Normal range of motion.  Lymphadenopathy:       Head (right side): No submandibular adenopathy present.       Head (left side): No submandibular adenopathy present.    He has no cervical adenopathy.  Neurological: He is alert and oriented to person, place, and time. He has normal strength. No cranial nerve deficit or sensory deficit.  Skin: Skin is warm and dry.  Psychiatric: He has a normal mood and affect. His speech is normal.  Nursing note and vitals reviewed.    ED Treatments / Results  Labs (all labs ordered are listed, but only abnormal results are displayed) Labs Reviewed - No data to display  EKG  EKG Interpretation None       Radiology No results found.  Procedures Procedures (including critical care time)  Medications Ordered in ED Medications - No data to display   Initial Impression / Assessment and Plan / ED Course  I have reviewed the triage vital signs and the nursing notes.  Pertinent labs & imaging results that were available during my care of the patient were reviewed by me and considered in my medical decision making (see chart for details).       Final Clinical Impressions(s) / ED Diagnoses MDM The examination suggests multiple dental caries. The patient mobilizes secretions without problem. Speech is understandable without problem. The patient shows no signs of Ludwig's angina or other emergent changes. Patient given dental resources , and strongly advised to see a dentist as sone as possible. Prescription for  clindamycin, ibuprofen, and Ultram given to the patient.    Final diagnoses:  Infected dental caries    New Prescriptions New Prescriptions   No medications on file     Duayne CalBryant, Lyanna Blystone, PA-C 02/14/17 Windell [id number]    Doug SouJacubowitz, Sam, MD 02/15/17 [phone]

## 2017-02-14 NOTE — ED Notes (Signed)
Pt is complaining of dental pain at the bottom of mouth. Has cavities upon assessment and states this has been hurting for 3 days. Mild swelling to jaw.

## 2017-02-14 NOTE — ED Triage Notes (Signed)
Patient c/o right lower dental pain that started 2 days ago and is progressively getting worse. Patient reports swelling in lower jaw. Denies any fevers. Denies taking anything for pain or swelling.

## 2017-02-14 NOTE — Discharge Instructions (Signed)
You have severe decay of several teeth and infected gums. It is important that she see a dentist as soon as possible to prevent spread of this infection to vital organs. Please use clindamycin and ibuprofen with breakfast, lunch, dinner, and at bedtime. May use Ultram for more severe pain every 6 hours as needed. This medication may cause drowsiness, please do not drive, drink alcohol, operate machinery, or participated in activities requiring concentration when taking this medication. I have enclosed a list of dental resources to assist with your getting the assistance that you need.

## 2023-07-06 ENCOUNTER — Encounter (HOSPITAL_COMMUNITY): Payer: Self-pay | Admitting: *Deleted

## 2023-07-06 ENCOUNTER — Emergency Department (HOSPITAL_COMMUNITY): Payer: Self-pay

## 2023-07-06 ENCOUNTER — Other Ambulatory Visit: Payer: Self-pay

## 2023-07-06 ENCOUNTER — Emergency Department (HOSPITAL_COMMUNITY)
Admission: EM | Admit: 2023-07-06 | Discharge: 2023-07-07 | Disposition: A | Discharge status: Home / Self Care | Payer: Self-pay | Attending: Student | Admitting: Student

## 2023-07-06 DIAGNOSIS — L0201 Cutaneous abscess of face: Secondary | ICD-10-CM | POA: Insufficient documentation

## 2023-07-06 DIAGNOSIS — F17210 Nicotine dependence, cigarettes, uncomplicated: Secondary | ICD-10-CM | POA: Insufficient documentation

## 2023-07-06 DIAGNOSIS — L03211 Cellulitis of face: Secondary | ICD-10-CM | POA: Insufficient documentation

## 2023-07-06 DIAGNOSIS — K047 Periapical abscess without sinus: Secondary | ICD-10-CM | POA: Insufficient documentation

## 2023-07-06 LAB — I-STAT CHEM 8, ED
BUN: 13 mg/dL (ref 6–20)
Calcium, Ion: 1.17 mmol/L (ref 1.15–1.40)
Chloride: 103 mmol/L (ref 98–111)
Creatinine, Ser: 0.9 mg/dL (ref 0.61–1.24)
Glucose, Bld: 94 mg/dL (ref 70–99)
HCT: 42 % (ref 39.0–52.0)
Hemoglobin: 14.3 g/dL (ref 13.0–17.0)
Potassium: 3.8 mmol/L (ref 3.5–5.1)
Sodium: 139 mmol/L (ref 135–145)
TCO2: 24 mmol/L (ref 22–32)

## 2023-07-06 MED ORDER — SODIUM CHLORIDE 0.9 % IV SOLN
3.0000 g | Freq: Once | INTRAVENOUS | Status: AC
  Administered 2023-07-06: 3 g via INTRAVENOUS
  Filled 2023-07-06: qty 8

## 2023-07-06 MED ORDER — LIDOCAINE-EPINEPHRINE (PF) 2 %-1:200000 IJ SOLN
10.0000 mL | Freq: Once | INTRAMUSCULAR | Status: AC
  Administered 2023-07-06: 10 mL via INTRADERMAL
  Filled 2023-07-06: qty 20

## 2023-07-06 MED ORDER — MORPHINE SULFATE (PF) 4 MG/ML IV SOLN
4.0000 mg | Freq: Once | INTRAVENOUS | Status: AC
  Administered 2023-07-06: 4 mg via INTRAVENOUS
  Filled 2023-07-06: qty 1

## 2023-07-06 MED ORDER — IOHEXOL 300 MG/ML  SOLN
75.0000 mL | Freq: Once | INTRAMUSCULAR | Status: AC | PRN
  Administered 2023-07-06: 75 mL via INTRAVENOUS

## 2023-07-06 MED ORDER — ONDANSETRON HCL 4 MG/2ML IJ SOLN
4.0000 mg | Freq: Once | INTRAMUSCULAR | Status: AC
  Administered 2023-07-06: 4 mg via INTRAVENOUS
  Filled 2023-07-06: qty 2

## 2023-07-06 NOTE — ED Notes (Signed)
Patient transported to CT 

## 2023-07-06 NOTE — ED Provider Notes (Signed)
  Provider Note MRN:  [id number]  Arrival date & time: 07/07/23    ED Course and Medical Decision Making  Assumed care from Dr. Posey Rea at shift change.  Odontogenic infection with obvious fluctuant abscess to the right face.  Drained here in the emergency department.  Will touch base with oral surgeon given the CT findings of gas.  Patient is nontoxic, receiving IV antibiotics.  Case discussed with oral surgeon Dr. Sherrine Maples Reside who practices within the [facility] system.  We discussed the patient's condition and the CT findings.  Gas-forming organisms are common with regard to odontogenic infections.  And so finding gas on the CT does not necessarily commit patient to inpatient management.  Patient has normal vital signs, no fever, no trismus, resting comfortably, extremely nontoxic.  Sounds like it was a very successful I&D with copious drainage.  Given all the above, patient seems appropriate for discharge with close follow-up and patient is very happy with this plan, he wants to go home and check on his dad who is having his own health problems.  He is happy to go to [facility] tomorrow to see the oral surgeon in the office.  Strict return precautions were discussed.  Procedures  Final Clinical Impressions(s) / ED Diagnoses     ICD-10-CM   1. Facial cellulitis  L03.211     2. Dental infection  K04.7     3. Facial abscess  L02.01       ED Discharge Orders          Ordered    amoxicillin-clavulanate (AUGMENTIN) 875-125 MG tablet  Every 12 hours        07/07/23 0114              Discharge Instructions      You were evaluated in the Emergency Department and after careful evaluation, we did not find any emergent condition requiring admission or further testing in the hospital.  Symptoms seem to be due to a facial abscess, with the infection coming from an infected tooth.  We drained your abscess here in the emergency department.  We discussed the face with the oral surgeons, they would  like to see you in the office tomorrow.  Please call the number provided to get a specific appointment time.  Take the Augmentin antibiotic as directed.  Please return to the Emergency Department if you experience any worsening of your condition.   Thank you for allowing [place] to be a part of your care.      Elmer Sow. Pilar Plate, MD [facility] Emergency Medicine [facility] [email or web address]    Sabas Sous, MD 07/07/23 Georgiann Mohs

## 2023-07-06 NOTE — ED Triage Notes (Signed)
Pt states he noticed right lower jaw swelling x 4 days ago and states the pain goes up to his ear

## 2023-07-07 MED ORDER — AMOXICILLIN-POT CLAVULANATE 875-125 MG PO TABS: 1.0000 | ORAL_TABLET | Freq: Two times a day (BID) | ORAL | 0 refills | Status: AC

## 2023-07-07 NOTE — ED Provider Notes (Signed)
[facility] DE[facility] AT [facility] Provider Note  CSN: [id number] Arrival date & time: 07/06/23 1625  Chief Complaint(s) Facial Swelling  HPI Collin Dunn is a 32 y.o. male who presents emergency room for evaluation of right-sided facial swelling.  States he has a long history of dental issues and it usually when he swells on the side of the face he will have associated dental pain.  However over the last 4 days he has not had associated dental pain and has had a enlarging swelling mass on the right side of his jaw that extends up to his ear.  Denies associated nausea, vomiting, chest pain, shortness of breath or other systemic symptoms.  Denies fever or chills.   Past Medical History History reviewed. No pertinent past medical history. There are no active problems to display for this patient.  Home Medication(s) Prior to Admission medications   Medication Sig Start Date End Date Taking? Authorizing Provider  amoxicillin-clavulanate (AUGMENTIN) 875-125 MG tablet Take 1 tablet by mouth every 12 (twelve) hours for 7 days. 07/07/23 07/14/23 Yes Bero, Elmer Sow, MD  acetaminophen (TYLENOL) 500 MG tablet Take 500 mg by mouth every 6 (six) hours as needed for mild pain or moderate pain.    [provider]  Aspirin-Acetaminophen-Caffeine (GOODY HEADACHE PO) Take 1 packet by mouth daily as needed (FOR PAIN).    [provider]  ibuprofen (ADVIL,MOTRIN) 600 MG tablet Take 1 tablet (600 mg total) by mouth 4 (four) times daily. 02/14/17   [name], PA-C  methocarbamol (ROBAXIN) 500 MG tablet Take 2 tablets (1,000 mg total) by mouth 4 (four) times daily as needed for muscle spasms (muscle spasm/pain). 12/04/15   [name], DO  naproxen (NAPROSYN) 250 MG tablet Take 1 tablet (250 mg total) by mouth 2 (two) times daily as needed for mild pain or moderate pain (take with food). 12/04/15   [name], DO  traMADol (ULTRAM) 50 MG tablet Take 1 tablet  (50 mg total) by mouth every 6 (six) hours as needed. 02/14/17   [name], PA-C                                                                                                                                    Past Surgical History History reviewed. No pertinent surgical history. Family History History reviewed. No pertinent family history.  Social History Social History   Tobacco Use   Smoking status: Every Day    Current packs/day: 1.00    Average packs/day: 1 pack/day for 7.0 years (7.0 ttl pk-yrs)    Types: Cigarettes   Smokeless tobacco: Never  Vaping Use   Vaping status: Never Used  Substance Use Topics   Alcohol use: No   Drug use: No   Allergies Patient has no known allergies.  Review of Systems Review of Systems  HENT:  Positive for facial swelling.     Physical Exam Vital  Signs  I have reviewed the triage vital signs BP 122/77   Pulse (!) 57   Temp 98.7 F (37.1 C) (Oral)   Resp 18   Ht 5\' 10"  (1.778 m)   Wt 59 kg   SpO2 97%   BMI 18.65 kg/m   Physical Exam Constitutional:      General: He is not in acute distress.    Appearance: Normal appearance.  HENT:     Head: Normocephalic and atraumatic.     Comments: 4 cm tender fluctuant erythematous lesion along the angle of the mandible on the right    Nose: No congestion or rhinorrhea.  Eyes:     General:        Right eye: No discharge.        Left eye: No discharge.     Extraocular Movements: Extraocular movements intact.     Pupils: Pupils are equal, round, and reactive to light.  Cardiovascular:     Rate and Rhythm: Normal rate and regular rhythm.     Heart sounds: No murmur heard. Pulmonary:     Effort: No respiratory distress.     Breath sounds: No wheezing or rales.  Abdominal:     General: There is no distension.     Tenderness: There is no abdominal tenderness.  Musculoskeletal:        General: Normal range of motion.     Cervical back: Normal range of motion.  Skin:     General: Skin is warm and dry.  Neurological:     General: No focal deficit present.     Mental Status: He is alert.     ED Results and Treatments Labs (all labs ordered are listed, but only abnormal results are displayed) Labs Reviewed  I-STAT CHEM 8, ED                                                                                                                          Radiology CT Soft Tissue Neck W Contrast Result Date: 07/06/2023 CLINICAL DATA:  Right facial swelling EXAM: CT NECK WITH CONTRAST TECHNIQUE: Multidetector CT imaging of the neck was performed using the standard protocol following the bolus administration of intravenous contrast. RADIATION DOSE REDUCTION: This exam was performed according to the departmental dose-optimization program which includes automated exposure control, adjustment of the mA and/or kV according to patient size and/or use of iterative reconstruction technique. CONTRAST:  75mL OMNIPAQUE IOHEXOL 300 MG/ML  SOLN COMPARISON:  None Available. FINDINGS: Pharynx and larynx: Normal. No mass or swelling. Salivary glands: No inflammation, mass, or stone. Thyroid: Normal. Lymph nodes: None enlarged or abnormal density. Vascular: Negative. Limited intracranial: Negative. Visualized orbits: Negative. Mastoids and visualized paranasal sinuses: Partial opacification of the right maxillary sinus. Mastoids are clear. Skeleton: Negative Upper chest: Negative Other: There are multiple caries of the right mandibular molars and premolars. There is a large periapical lucency of tooth 32. There is a large area of lower right facial superficial  tissue edema. Loculated gas within the right cheek subcutaneous fat may be secondary to an attempted intervention. Otherwise, this would be concerning for gas-forming infection. IMPRESSION: 1. Lower right facial cellulitis, likely odontogenic. Loculated gas within the right cheek subcutaneous fat may be secondary to an attempted intervention.  Otherwise, this would be concerning for gas-forming infection. 2. Multiple caries of the right mandibular molars and premolars. Large periapical lucency of tooth 32. This region is the presumed origin of above described process. Electronically Signed   By: Deatra Robinson M.D.   On: 07/06/2023 23:02    Pertinent labs & imaging results that were available during my care of the patient were reviewed by me and considered in my medical decision making (see MDM for details).  Medications Ordered in ED Medications  iohexol (OMNIPAQUE) 300 MG/ML solution 75 mL (75 mLs Intravenous Contrast Given 07/06/23 2229)  lidocaine-EPINEPHrine (XYLOCAINE W/EPI) 2 %-1:200000 (PF) injection 10 mL (10 mLs Intradermal Given by Other 07/06/23 2323)  morphine (PF) 4 MG/ML injection 4 mg (4 mg Intravenous Given 07/06/23 2324)  ondansetron (ZOFRAN) injection 4 mg (4 mg Intravenous Given 07/06/23 2324)  Ampicillin-Sulbactam (UNASYN) 3 g in sodium chloride 0.9 % 100 mL IVPB (0 g Intravenous Stopped 07/07/23 0024)                                                                                                                                     Procedures .Incision and Drainage  Date/Time: 07/07/2023 12:52 PM  Performed by: Glendora Score, MD Authorized by: Glendora Score, MD   Location:    Type:  Abscess   Size:  4cm   Location:  Head   Head location:  Face Pre-procedure details:    Skin preparation:  Chlorhexidine with alcohol Sedation:    Sedation type:  None Anesthesia:    Anesthesia method:  Local infiltration   Local anesthetic:  Lidocaine 2% WITH epi Procedure type:    Complexity:  Simple Procedure details:    Incision types:  Single straight   Wound management:  Probed and deloculated   Drainage:  Purulent   Drainage amount:  Moderate   Wound treatment:  Wound left open Post-procedure details:    Procedure completion:  Tolerated well, no immediate complications   (including critical care  time)  Medical Decision Making / ED Course   This patient presents to the ED for concern of facial swelling, this involves an extensive number of treatment options, and is a complaint that carries with it a high risk of complications and morbidity.  The differential diagnosis includes abscess, cellulitis, odontogenic infection, Ludwig's, RPA, sebaceous cyst, folliculitis  MDM: Patient seen emergency room for evaluation of facial pain and swelling.  Physical exam with poor dentition but no interior evidence of periapical abscess in the mouth.  Large 4 cm fluctuant tender erythematous lesion along the angle of the mandible on the right.  Laboratory evaluation largely unremarkable.  CT soft tissue neck concerning for lower right facial cellulitis likely odontogenic with loculated gas within the right cheek.  Incision and drainage performed leading to evacuation of purulent fluid.  Patient started on Unasyn.  At time of signout, patient pending discussion with oral surgery at [facility] given deep space gas.  Please see provider signout note for continuation of workup.   Additional history obtained:  -External records from outside source obtained and reviewed including: Chart review including previous notes, labs, imaging, consultation notes   Lab Tests: -I ordered, reviewed, and interpreted labs.   The pertinent results include:   Labs Reviewed  I-STAT CHEM 8, ED        Imaging Studies ordered: I ordered imaging studies including CT soft tissue neck I independently visualized and interpreted imaging. I agree with the radiologist interpretation   Medicines ordered and prescription drug management: Meds ordered this encounter  Medications   iohexol (OMNIPAQUE) 300 MG/ML solution 75 mL   lidocaine-EPINEPHrine (XYLOCAINE W/EPI) 2 %-1:200000 (PF) injection 10 mL   morphine (PF) 4 MG/ML injection 4 mg    Refill:  0   ondansetron (ZOFRAN) injection 4 mg   Ampicillin-Sulbactam (UNASYN) 3 g in  sodium chloride 0.9 % 100 mL IVPB    Antibiotic Indication::   Wound Infection   amoxicillin-clavulanate (AUGMENTIN) 875-125 MG tablet    Sig: Take 1 tablet by mouth every 12 (twelve) hours for 7 days.    Dispense:  14 tablet    Refill:  0    -I have reviewed the patients home medicines and have made adjustments as needed  Critical interventions none    Cardiac Monitoring: The patient was maintained on a cardiac monitor.  I personally viewed and interpreted the cardiac monitored which showed an underlying rhythm of: NSR  Social Determinants of Health:  Factors impacting patients care include: none   Reevaluation: After the interventions noted above, I reevaluated the patient and found that they have :improved  Co morbidities that complicate the patient evaluation History reviewed. No pertinent past medical history.    Dispostion: I considered admission for this patient, and disposition pending completion of consultation with oral surgeons.  Please see provider signout note for continuation of workup.     Final Clinical Impression(s) / ED Diagnoses Final diagnoses:  Facial cellulitis  Dental infection  Facial abscess     @PCDICTATION @    Glendora Score, MD 07/07/23 1255

## 2023-07-07 NOTE — ED Notes (Signed)
ED Provider at bedside. 

## 2023-07-07 NOTE — Discharge Instructions (Addendum)
You were evaluated in the Emergency Department and after careful evaluation, we did not find any emergent condition requiring admission or further testing in the hospital.  Symptoms seem to be due to a facial abscess, with the infection coming from an infected tooth.  We drained your abscess here in the emergency department.  We discussed the face with the oral surgeons, they would like to see you in the office tomorrow.  Please call the number provided to get a specific appointment time.  Take the Augmentin antibiotic as directed.  Please return to the Emergency Department if you experience any worsening of your condition.   Thank you for allowing [place] to be a part of your care.
